# Patient Record
Sex: Male | Born: 1995 | Hispanic: Yes | Marital: Single | Smoking: Never smoker
Health system: Southern US, Community
[De-identification: ages and names within clinical notes are randomized; demographics above are authoritative.]

## PROBLEM LIST (undated history)

## (undated) DIAGNOSIS — K4090 Unilateral inguinal hernia, without obstruction or gangrene, not specified as recurrent: Secondary | ICD-10-CM

## (undated) HISTORY — DX: Unilateral inguinal hernia, without obstruction or gangrene, not specified as recurrent: K40.90

---
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
## 2015-04-13 ENCOUNTER — Emergency Department: Payer: PRIVATE HEALTH INSURANCE

## 2015-04-13 ENCOUNTER — Emergency Department
Admission: EM | Admit: 2015-04-13 | Discharge: 2015-04-13 | Disposition: A | Discharge status: Home / Self Care | Payer: PRIVATE HEALTH INSURANCE | Attending: Emergency Medicine | Admitting: Emergency Medicine

## 2015-04-13 DIAGNOSIS — K4090 Unilateral inguinal hernia, without obstruction or gangrene, not specified as recurrent: Secondary | ICD-10-CM | POA: Diagnosis not present

## 2015-04-13 DIAGNOSIS — N50811 Right testicular pain: Secondary | ICD-10-CM

## 2015-04-13 DIAGNOSIS — N508 Other specified disorders of male genital organs: Secondary | ICD-10-CM | POA: Diagnosis present

## 2015-04-13 LAB — URINALYSIS COMPLETE WITH MICROSCOPIC (ARMC ONLY)
BILIRUBIN URINE: NEGATIVE
Bacteria, UA: NONE SEEN
GLUCOSE, UA: NEGATIVE mg/dL
Ketones, ur: NEGATIVE mg/dL
Leukocytes, UA: NEGATIVE
Nitrite: NEGATIVE
PH: 6 (ref 5.0–8.0)
Protein, ur: NEGATIVE mg/dL
Specific Gravity, Urine: 1.031 — ABNORMAL HIGH (ref 1.005–1.030)

## 2015-04-13 MED ORDER — KETOROLAC TROMETHAMINE 10 MG PO TABS
10.0000 mg | ORAL_TABLET | Freq: Once | ORAL | Status: AC
  Administered 2015-04-13: 10 mg via ORAL
  Filled 2015-04-13: qty 1

## 2015-04-13 MED ORDER — KETOROLAC TROMETHAMINE 10 MG PO TABS: 10.0000 mg | ORAL_TABLET | Freq: Three times a day (TID) | ORAL | Status: DC | PRN

## 2015-04-13 NOTE — ED Notes (Signed)
Patient states right testicular pain "for a while." Tonight feels like something is just not right. Denies difficulty urinating.

## 2015-04-13 NOTE — ED Provider Notes (Signed)
[facility] Emergency Department Provider Note  ____________________________________________  Time seen: 5:10 AM  I have reviewed the triage vital signs and the nursing notes.   HISTORY  Chief Complaint Testicle Pain     HPI Dale Archer is a 19 y.o. male presents with right inguinal pain/right testicular pain times "for a while". Patient states that his previous employment he had to do strenuous lifting persistently and was concerned that he may have "hernia". Patient denies any fever no nausea no vomiting. Current pain score 4 out of 10     Past medical history None There are no active problems to display for this patient.   Past surgical history None No current outpatient prescriptions on file.  Allergies No known drug allergies No family history on file.  Social History Social History  Substance Use Topics  . Smoking status: None  . Smokeless tobacco: None  . Alcohol Use: No    Review of Systems  Constitutional: Negative for fever. Eyes: Negative for visual changes. ENT: Negative for sore throat. Cardiovascular: Negative for chest pain. Respiratory: Negative for shortness of breath. Gastrointestinal: Negative for abdominal pain, vomiting and diarrhea. Genitourinary: Positive right testicular pain Musculoskeletal: Negative for back pain. Skin: Negative for rash. Neurological: Negative for headaches, focal weakness or numbness.   10-point ROS otherwise negative.  ____________________________________________   PHYSICAL EXAM:  VITAL SIGNS: ED Triage Vitals  Enc Vitals Group     BP 04/13/15 0400 167/92 mmHg     Pulse Rate 04/13/15 0400 75     Resp 04/13/15 0500 18     Temp 04/13/15 0400 97.8 F (36.6 C)     Temp Source 04/13/15 0400 Oral     SpO2 04/13/15 0400 100 %     Weight 04/13/15 0400 170 lb (77.111 kg)     Height 04/13/15 0400 5\' 4"  (1.626 m)     Head Cir --      Peak Flow --      Pain Score 04/13/15 0356  5     Pain Loc --      Pain Edu? --      Excl. in GC? --      Constitutional: Alert and oriented. Well appearing and in no distress. Eyes: Conjunctivae are normal. PERRL. Normal extraocular movements. ENT   Head: Normocephalic and atraumatic.   Nose: No congestion/rhinnorhea.   Mouth/Throat: Mucous membranes are moist.   Neck: No stridor. Cardiovascular: Normal rate, regular rhythm. Normal and symmetric distal pulses are present in all extremities. No murmurs, rubs, or gallops. Respiratory: Normal respiratory effort without tachypnea nor retractions. Breath sounds are clear and equal bilaterally. No wheezes/rales/rhonchi. Gastrointestinal: Soft and nontender. No distention. There is no CVA tenderness. Genitourinary: Right Inguinal hernia appreciated on exam Musculoskeletal: Nontender with normal range of motion in all extremities. No joint effusions.  No lower extremity tenderness nor edema. Neurologic:  Normal speech and language. No gross focal neurologic deficits are appreciated. Speech is normal.  Skin:  Skin is warm, dry and intact. No rash noted. Psychiatric: Mood and affect are normal. Speech and behavior are normal. Patient exhibits appropriate insight and judgment.  ____________________________________________    LABS (pertinent positives/negatives)  Labs Reviewed  URINALYSIS COMPLETEWITH MICROSCOPIC ([facility] ONLY)    Radiology:     US Scrotum (Final result) Result time: 04/13/15 05:01:21   Final result by Rad Results In Interface (04/13/15 05:01:21)   Narrative:   CLINICAL DATA: Right testicular pain for 3 months.  EXAM: SCROTAL ULTRASOUND  DOPPLER ULTRASOUND OF THE TESTICLES  TECHNIQUE: Complete ultrasound examination of the testicles, epididymis, and other scrotal structures was performed. Color and spectral Doppler ultrasound were also utilized to evaluate blood flow to the testicles.  COMPARISON: None.  FINDINGS: Right  testicle  Measurements: 3.9 x 2.4 x 2.8 cm. No mass or microlithiasis visualized.  Left testicle  Measurements: 3.8 x 2.4 x 2.8 cm. No mass or microlithiasis visualized.  Right epididymis: Normal in size and appearance.  Left epididymis: Small cyst or spermatocele. Maximal measurement is about 5.5 mm.  Hydrocele: None visualized.  Varicocele: None visualized.  Pulsed Doppler interrogation of both testes demonstrates normal low resistance arterial and venous waveforms bilaterally.  Normal homogeneous flow is demonstrated throughout both testes and epididymides on color flow Doppler imaging.  IMPRESSION: Normal ultrasound appearance of the testicles bilaterally. No evidence of mass or torsion. Small cyst or spermatocele in the left epididymis.   Electronically Signed By: Burman Nieves M.D. On: 04/13/2015 05:01     INITIAL IMPRESSION / ASSESSMENT AND PLAN / ED COURSE  Pertinent labs & imaging results that were available during my care of the patient were reviewed by me and considered in my medical decision making (see chart for details).   History of physical exam concerning for possible right inguinal hernia such patient will be referred to Dr. Excell Seltzer for inguinal hernia evaluation and possible repair. ____________________________________________   FINAL CLINICAL IMPRESSION(S) / ED DIAGNOSES  Final diagnoses:  Right inguinal hernia      Darci Current, MD 04/13/15 [phone]

## 2015-04-13 NOTE — Discharge Instructions (Signed)
Hernia inguinal - Adultos  °(Inguinal Hernia, Adult) ° Los músculos mantienen todos los órganos del cuerpo en el lugar correcto. Pero si se produce un punto débil entre los músculos, algunos pueden protruir. Eso se llama hernia. Cuando esto sucede en la parte inferior del vientre (abdomen), se trata de una hernia inguinal. (Toma su nombre de una parte del cuerpo que en esta región se llamada canal inguinal). Un punto débil en la pared de los músculos deja que un poco de grasa o parte del intestino delgado salgan hacia afuera. Una hernia inguinal puede desarrollarse a cualquier edad. Los hombres la sufren con más frecuencia que las mujeres.  °CAUSAS  °En los adultos, la hernia inguinal desarrolla con el tiempo.  °· Las causas pueden ser: °¨ Un esfuerzo súbito de los músculos de la parte inferior del abdomen. °¨ Levantar objetos pesados. °¨ Dificultad para mover el intestino. La dificultad para mover el intestino (constipación) puede llevar a una hernia. °¨ Tos constante. La causa puede ser el tabaquismo o una enfermedad pulmonar. °¨ Tener sobrepeso. °¨ El embarazo. °¨ Tener un empleo que requiera permanecer largos períodos de pie o levantar objetos pesados. °¨ Haber sufrido de una hernia inguinal anteriormente. °En algunos casos puede convertirse en una situación de emergencia. Cuando esto ocurre, se llama hernia inguinal estrangulada. Se produce cuando una parte del intestino delgado se desliza a través del punto débil y no puede volver al abdomen. El flujo de sangre puede interrumpirse. Si esto ocurre, una parte del intestino puede morir. Esta situación requiere una cirugía de urgencia.  °SÍNTOMAS  °Generalmente una hernia inguinal pequeña no tiene síntomas. Se diagnostica cuando un profesional de la salud hace un examen físico. Las hernias más grandes generalmente presentan síntomas.  °· En los adultos, los síntomas incluyen: °¨ Un bulto en la ingle. Es fácil de detectar cuando la persona está de pie. Puede  desaparecer al estar acostado. °¨ Los hombres pueden tener un bulto en el escroto. °¨ Dolor o ardor en la ingle. Esto ocurre especialmente al levantar objetos, realizar un esfuerzo o toser. °¨ Dolor sordo o sensación de presión en la ingle. °· Los signos de una hernia estrangulada pueden ser: °¨ Una protuberancia en la ingle que duele mucho y está sensible al tacto. °¨ Un bulto que se vuelve de color rojo o púrpura. °¨ Fiebre, náuseas y vómitos. °¨ Imposibilidad de evacuar el intestino o de eliminar gases. °DIAGNÓSTICO  °Para diagnosticar una hernia inguinal, el profesional le hará un examen físico.  °· Incluirá preguntas acerca de los síntomas que haya notado. °· El médico palpará el área de la ingle y le pedirá que tosa. Si palpa una hernia inguinal, el médico podrá tratar de deslizarla de nuevo hacia adentro el abdomen. °· Por lo general no se necesitan otros estudios. °TRATAMIENTO  °Los tratamientos pueden variar. Dependerán del tamaño de la hernia. Las opciones incluyen:  °· Observación cuidadosa. Esto a menudo se sugiere si la hernia es pequeña y usted no ha tenido síntomas. °¨ No se realizará ningún procedimiento médico excepto que aparezcan síntomas. °¨ Tendrá que prestar atención a los síntomas. Si tiene síntomas, comuníquese con su médico de inmediato. °· Cirugía. Se realiza si la hernia es grande o si tiene síntomas. °¨ Cirugía abierta. Por lo general, este es un procedimiento ambulatorio (no tendrá que pasar la noche en el hospital). Se realiza un corte (incisión) a través de la piel de la ingle. La hernia se vuelve a colocar en el interior del abdomen.   Luego se repara la zona débil en los músculos con una herniorrafia o hernioplastia. Herniorrafía: en este tipo de cirugía, se suturan juntos los músculos débiles. Hernioplastía: se coloca un parche o malla para cerrar el área débil en la pared abdominal. °¨ Laparoscopia. En este procedimiento, el cirujano hace incisiones pequeñas. Se coloca en el abdomen  un tubo delgado con una pequeña cámara de video (llamado laparoscopio). El cirujano repara la hernia con una malla, observando en una cámara de vídeo y utilizando dos instrumentos largos. °INSTRUCCIONES PARA EL CUIDADO EN EL HOGAR  °· Después de la cirugía de reparación de una hernia inguinal: °¨ Necesitará tomar un analgésico para el dolor recetado por su médico. Siga cuidadosamente todas las indicaciones. °¨ Tendrá que cuidar la herida de la incisión. °¨ Deberá restringir algunas actividades por un tiempo. Incluirá no levantar objetos pesados   durante varias semanas. Tampoco podrá hacer nada demasiado activo durante algunas semanas. La vuelta al trabajo dependerá del tipo de trabajo que tenga. °· Durante períodos de "espera vigilante", usted debe: °¨ Mantenga un peso saludable. °¨ Consumir una dieta rica en fibra (frutas, verduras y granos enteros). °¨ Beba gran cantidad de líquidos para evitar la constipación. Esto significa beber suficiente agua y otros líquidos para mantener la orina clara o de color amarillo pálido. °¨ No levante objetos pesados. °¨ No permanezca de pie durante largos períodos. °¨ Deje de fumar. Evite toser con frecuencia. °SOLICITE ATENCIÓN MÉDICA SI:  °· Aparece una protuberancia en el área de la ingle. °· Siente dolor, tiene sensación de quemazón o de presión en la ingle. Esto podría empeorar si levanta pesos o hace esfuerzos. °· Tiene fiebre de más de 100.5° F (38.1° C). °SOLICITE ATENCIÓN MÉDICA DE INMEDIATO SI:  °· El dolor en la ingle aumenta repentinamente. °· Una protuberancia en la ingle se hace más grande y no baja. °· En los hombres, un dolor repentino en el escroto. O el escroto aumenta de tamaño. °· Un bulto en el área de la ingle se vuelve de color rojo o púrpura y es dolorosa al tacto. °· Tiene náuseas o vómitos que no desaparecen. °· Siente que su corazón late mucho más rápido de lo normal. °· No puede mover el intestino o eliminar gases. °· Tiene fiebre de más de 102.0° F  (38.9° C). °Document Released: 11/18/2012 °ExitCare® Patient Information ©2015 [employer]. This information is not intended to replace advice given to you by your health care provider. Make sure you discuss any questions you have with your health care provider. ° °

## 2015-04-13 NOTE — ED Notes (Signed)
Patient reports of right intermittent testicle pain x 2-3 months, reports could not sleep yesterday and pain has increased. 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 Patient alert and oriented x 4, respirations even and unlabored.

## 2016-02-03 IMAGING — US US ART/VEN ABD/PELV/SCROTUM DOPPLER LTD
1 series · 14 of 25 positions shown · non-contrast
Comparison: None.

CLINICAL DATA: Right testicular pain for 3 months.

EXAM:
SCROTAL ULTRASOUND
DOPPLER ULTRASOUND OF THE TESTICLES
TECHNIQUE: Complete ultrasound examination of the testicles, epididymis, and
other scrotal structures was performed. Color and spectral Doppler
ultrasound were also utilized to evaluate blood flow to the
testicles.

[Series 1: us art/ven abd/pelv/scrotum doppler ltd · 0.05mm/px · 14 of 51 slices shown]
[im 1/51]
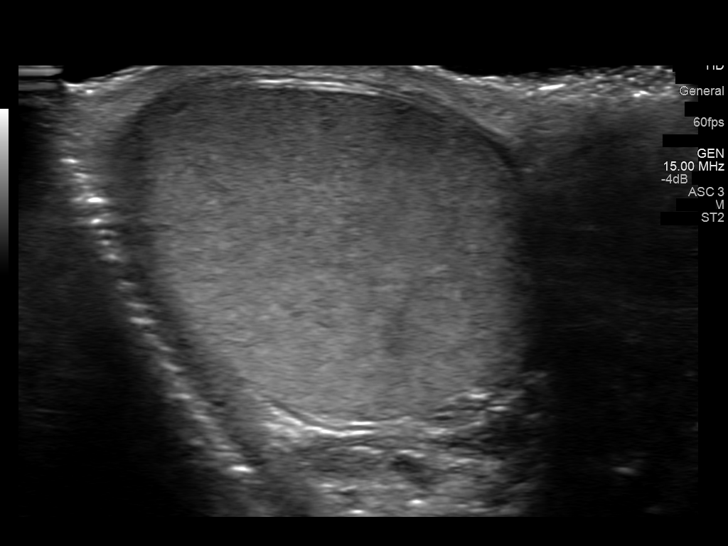
[im 5/51]
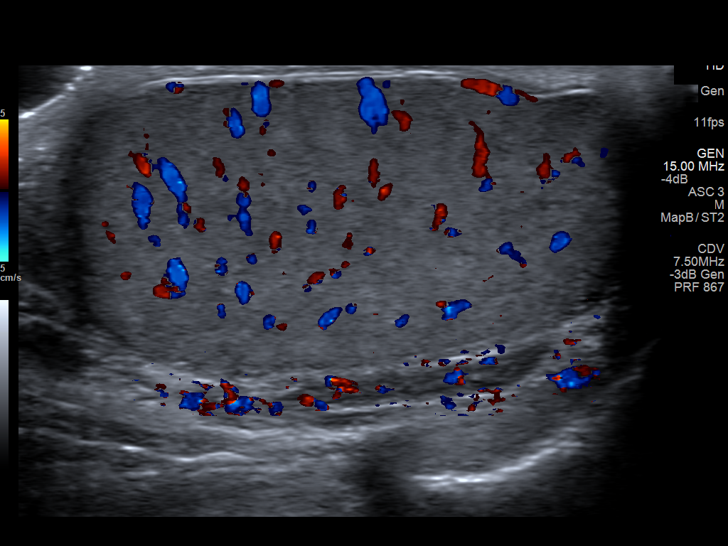
[im 9/51]
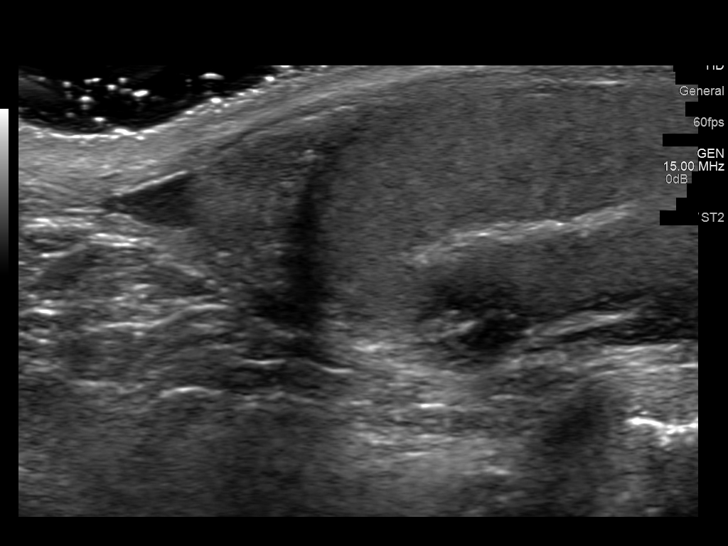
[im 13/51]
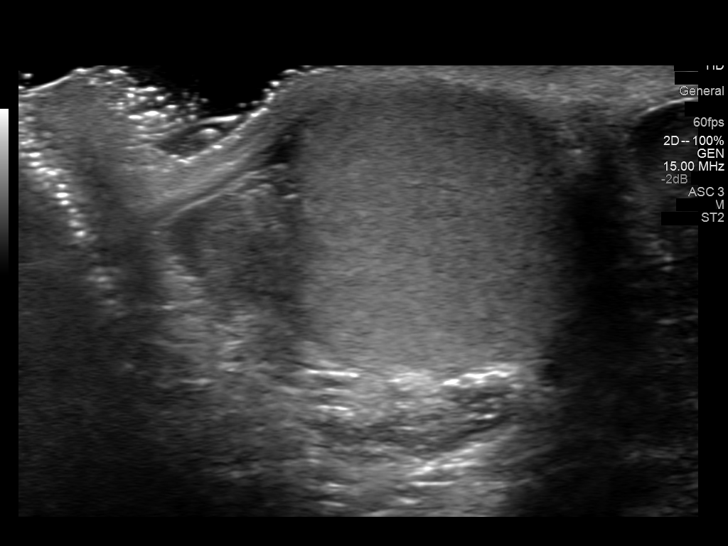
[im 17/51]
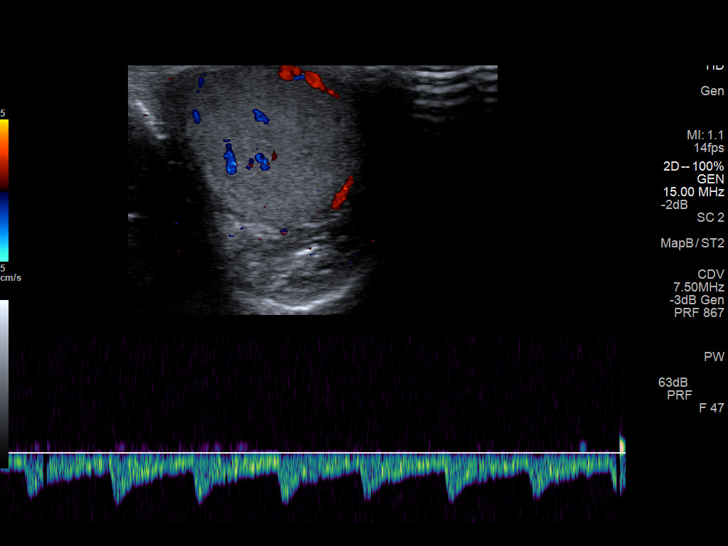
[im 19/51]
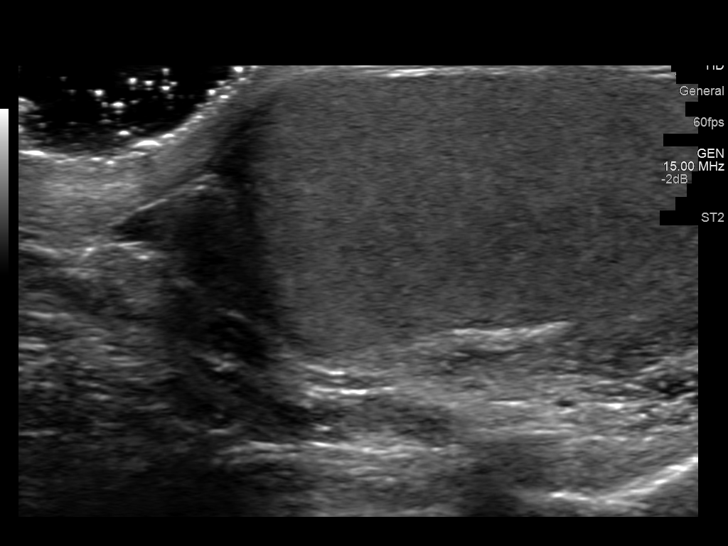
[im 23/51]
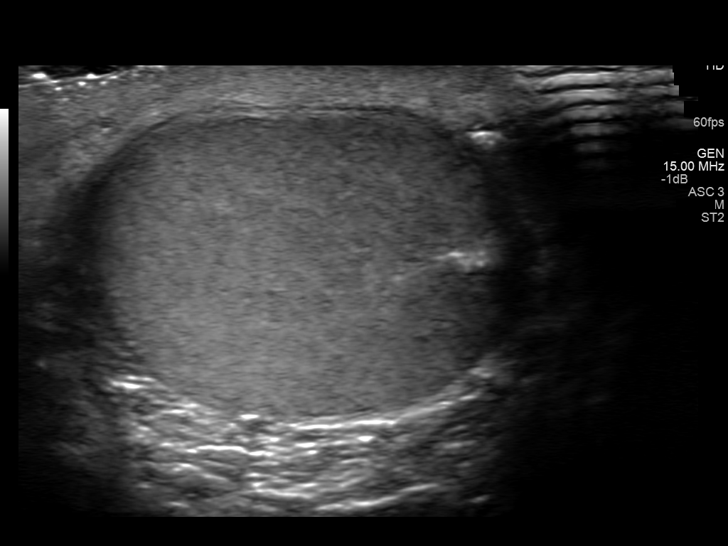
[im 28/51]
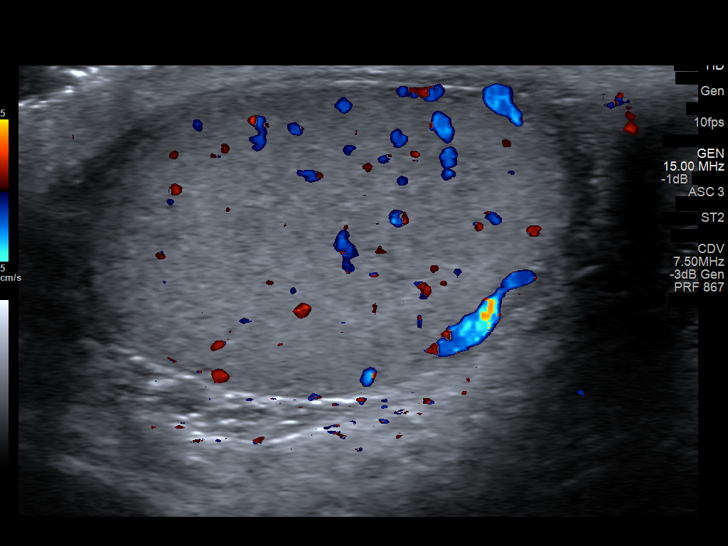
[im 32/51]
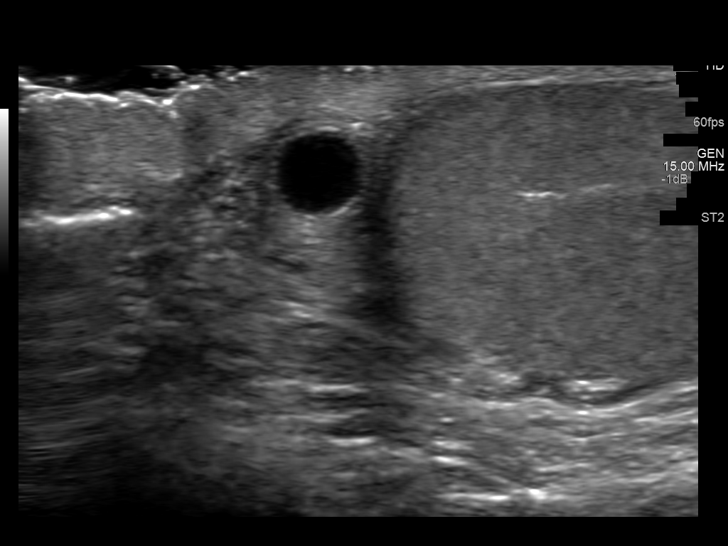
[im 34/51]
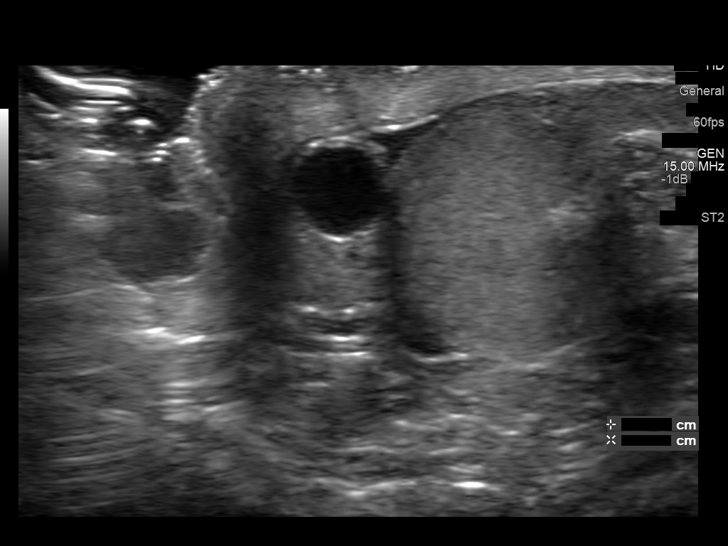
[im 38/51]
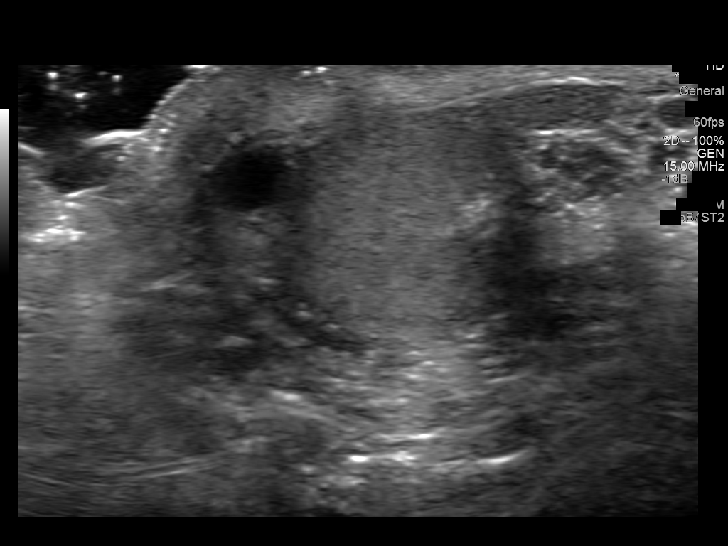
[im 42/51]
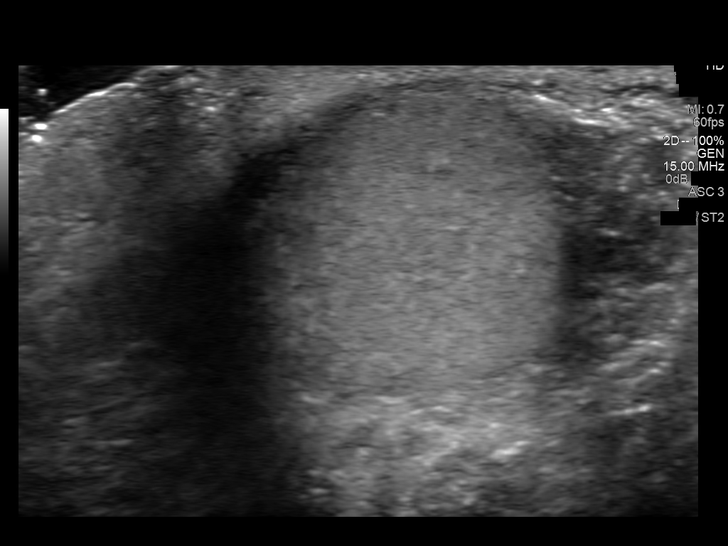
[im 46/51]
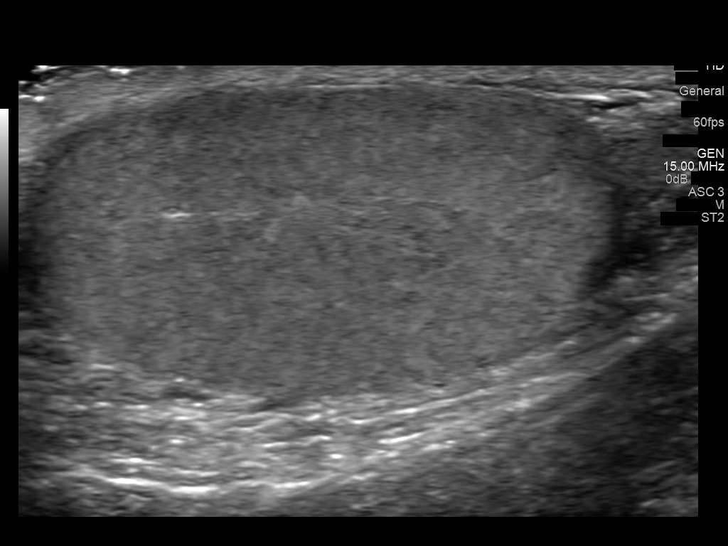
[im 51/51]
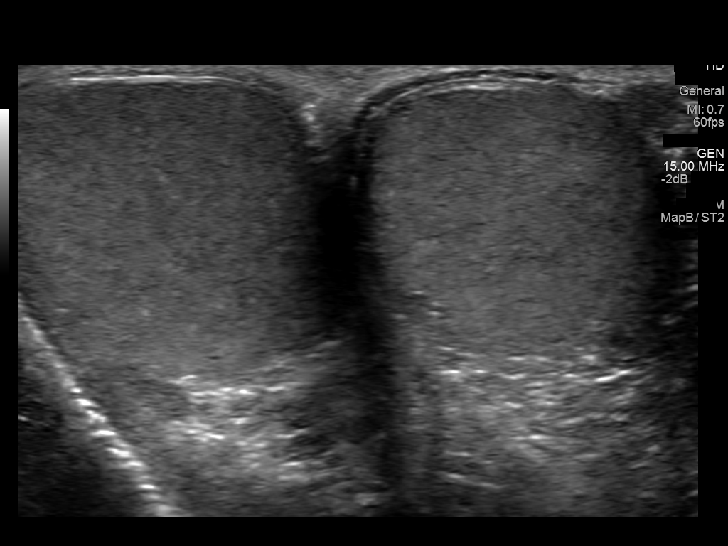

[14 of 25 positions shown; findings below may reference images not displayed]

FINDINGS: Right testicle

Measurements: 3.9 x 2.4 x 2.8 cm. No mass or microlithiasis
visualized.

Left testicle

Measurements: 3.8 x 2.4 x 2.8 cm. No mass or microlithiasis
visualized.

Right epididymis:  Normal in size and appearance.

Left epididymis: Small cyst or spermatocele. Maximal measurement is
about 5.5 mm.

Hydrocele:  None visualized.

Varicocele:  None visualized.

Pulsed Doppler interrogation of both testes demonstrates normal low
resistance arterial and venous waveforms bilaterally.

Normal homogeneous flow is demonstrated throughout both testes and
epididymides on color flow Doppler imaging.
IMPRESSION: Normal ultrasound appearance of the testicles bilaterally. No
evidence of mass or torsion. Small cyst or spermatocele in the left
epididymis.

## 2017-11-22 ENCOUNTER — Encounter: Payer: Self-pay | Admitting: Emergency Medicine

## 2017-11-22 ENCOUNTER — Emergency Department: Payer: PRIVATE HEALTH INSURANCE

## 2017-11-22 ENCOUNTER — Other Ambulatory Visit: Payer: Self-pay

## 2017-11-22 DIAGNOSIS — F1990 Other psychoactive substance use, unspecified, uncomplicated: Secondary | ICD-10-CM | POA: Insufficient documentation

## 2017-11-22 DIAGNOSIS — F419 Anxiety disorder, unspecified: Secondary | ICD-10-CM | POA: Insufficient documentation

## 2017-11-22 NOTE — ED Triage Notes (Addendum)
Patient ambulatory to triage with steady gait, without difficulty; pt anxious, wearing sunglasses; pt reports this evening having sensation of chest pounding after [facility]; denies hx of same and denies symptoms at present; st "I feel like I need to be honest with you, I recently began a job and feel overwhelmed and feel like my sleep schedule has not adjusted, not sleeping well; went some where and got something; a piece of paper with a picachu on it and took it"; pt cont to be very anxious; pt refusing blood work and urine specimen; explained importance of performing such but pt cont to refuse and leaves triage room to go back to lobby; 1st nurse R David RN notified of pt's behav and refusal for protocols

## 2017-11-23 ENCOUNTER — Emergency Department
Admission: EM | Admit: 2017-11-23 | Discharge: 2017-11-23 | Disposition: A | Discharge status: Home / Self Care | Payer: PRIVATE HEALTH INSURANCE | Attending: Emergency Medicine | Admitting: Emergency Medicine

## 2017-11-23 DIAGNOSIS — F419 Anxiety disorder, unspecified: Secondary | ICD-10-CM

## 2017-11-23 DIAGNOSIS — F1990 Other psychoactive substance use, unspecified, uncomplicated: Secondary | ICD-10-CM

## 2017-11-23 NOTE — ED Provider Notes (Signed)
[facility] Emergency Department Provider Note  ____________________________________________   First MD Initiated Contact with Patient 11/23/17 [phone]     (approximate)  I have reviewed the triage vital signs and the nursing notes.   HISTORY  Chief Complaint Palpitations and Anxiety  Level 5 caveat:  history/ROS may be limited by acute intoxication/drug use  HPI Dale Archer is a 22 y.o. male reports no chronic medical history and presents by private vehicle for evaluation of anxiety and chest discomfort.  He reports that he has been under stress at work and has not been sleeping recently.  He smokes tobacco and occasional marijuana but he also uses acid.  He dropped acid earlier today and then was working out and felt his heart pounding and felt very strange.  He thinks he "freaked out a little bit" and he and his girlfriend decided he should get checked out.  He refused lab work upfront because he states that he is scared of needles.  He did consent to a chest x-ray and EKG.  At this point, by the time I saw him hours into his emergency department stay due to waiting for an exam room, he says he feels completely normal although he admits that he is still under the influence.  He denies suicidal ideation and homicidal ideation.  He states that he thinks he just freaked out a bit but feels normal now.  History reviewed. No pertinent past medical history.  There are no active problems to display for this patient.   History reviewed. No pertinent surgical history.  Prior to Admission medications   Medication Sig Start Date End Date Taking? Authorizing Provider  ketorolac (TORADOL) 10 MG tablet Take 1 tablet (10 mg total) by mouth every 8 (eight) hours as needed. 04/13/15   Darci Current, MD    Allergies Patient has no known allergies.  No family history on file.  Social History Social History   Tobacco Use  . Smoking status: Never Smoker  .  Smokeless tobacco: Never Used  Substance Use Topics  . Alcohol use: No  . Drug use: Yes    Types: Marijuana    Review of Systems Level 5 caveat:  history/ROS may be limited by acute intoxication/drug use  Constitutional: No fever/chills Eyes: No visual changes. ENT: No sore throat. Cardiovascular: chest pain earlier, now resolved Respiratory: shortness of breath earlier, now resolved Gastrointestinal: No abdominal pain.  No nausea, no vomiting.  No diarrhea.  No constipation. Genitourinary: Negative for dysuria. Musculoskeletal: Negative for neck pain.  Negative for back pain. Integumentary: Negative for rash. Neurological: Negative for headaches, focal weakness or numbness.   ____________________________________________   PHYSICAL EXAM:  VITAL SIGNS: ED Triage Vitals  Enc Vitals Group     BP 11/22/17 2308 (!) 172/67     Pulse Rate 11/22/17 2308 98     Resp 11/22/17 2308 18     Temp --      Temp Source 11/22/17 2308 Oral     SpO2 11/22/17 2308 100 %     Weight 11/22/17 2308 83.9 kg (185 lb)     Height 11/22/17 2308 1.651 m (5\' 5" )     Head Circumference --      Peak Flow --      Pain Score 11/22/17 2307 0     Pain Loc --      Pain Edu? --      Excl. in GC? --     Constitutional: Alert and  oriented. Well appearing and in no acute distress. Eyes: Conjunctivae are normal.  Head: Atraumatic. Nose: No congestion/rhinnorhea. Mouth/Throat: Mucous membranes are moist. Neck: No stridor.  No meningeal signs.   Cardiovascular: Normal rate, regular rhythm. Good peripheral circulation. Grossly normal heart sounds. Respiratory: Normal respiratory effort.  No retractions. Lungs CTAB. Gastrointestinal: Soft and nontender. No distention.  Musculoskeletal: No lower extremity tenderness nor edema. No gross deformities of extremities. Neurologic:  Normal speech and language. No gross focal neurologic deficits are appreciated.  Skin:  Skin is warm, dry and intact. No rash  noted. Psychiatric: Mood and affect are odd, likely the result of some drug use, but he is not inappropriate, denies suicidality and homicidal ideation, and under the circumstances is acting relatively normal by the time that I saw him.  He shows good insight into the fact that his drug use is likely affecting the way he feels.  ____________________________________________   LABS (all labs ordered are listed, but only abnormal results are displayed)  Labs Reviewed - No data to display ____________________________________________  EKG  ED ECG REPORT I, Loleta Roseory Priyana Mccarey, the attending physician, personally viewed and interpreted this ECG.  Date: 11/22/2017 EKG Time: 23: 11 Rate: 101 Rhythm: Borderline sinus tachycardia QRS Axis: normal Intervals: normal ST/T Wave abnormalities: normal Narrative Interpretation: no evidence of acute ischemia  ____________________________________________  RADIOLOGY I, Loleta Roseory Alizea Pell, personally viewed and evaluated these images (plain radiographs) as part of my medical decision making, as well as reviewing the written report by the radiologist.  ED MD interpretation: No evidence of acute intrathoracic process  Official radiology report(s): Dg Chest 2 View  Result Date: 11/23/2017 CLINICAL DATA:  Acute onset of shortness of breath. Chest pounding. EXAM: CHEST - 2 VIEW COMPARISON:  None. FINDINGS: The lungs are well-aerated and clear. There is no evidence of focal opacification, pleural effusion or pneumothorax. The heart is normal in size; the mediastinal contour is within normal limits. No acute osseous abnormalities are seen. IMPRESSION: No acute cardiopulmonary process seen. Electronically Signed   By: Roanna RaiderJeffery  Chang M.D.   On: 11/23/2017 00:01    ____________________________________________   PROCEDURES  Critical Care performed: No   Procedure(s) performed:   Procedures   ____________________________________________   INITIAL IMPRESSION  / ASSESSMENT AND PLAN / ED COURSE  As part of my medical decision making, I reviewed the following data within the electronic MEDICAL RECORD NUMBER Nursing notes reviewed and incorporated, EKG interpreted  and Radiograph reviewed     Differential diagnosis includes, but is not limited to, drug use and its associated side effects, pneumothorax, ACS, pulmonary embolism.  Far and above my primary diagnosis is anxiety and drug use side effects.  He seems to be almost back to normal although he admits he is still likely under the influence.  He is not acting dangerous or inappropriate and he does not meet any criteria for involuntary commitment.  He did not drive himself here tonight and his girlfriend is going to come pick him up.  I offered to let him stay in the emergency department to speak with her psychiatrist but he declines and again he does not meet IVC requirements nor do I believe he would benefit from inpatient hospitalization or treatment for psychiatric illness.  We discussed following up with [facility] for help with his social and job stressors in addition to the drug use and he agrees this would be a good idea.  He spoke favorably of his girlfriend who he also describes as the mother of  his children and I do not believe he represents a danger to her or any of their kids.  I encouraged him to seek help for his issues but there is no indication for keeping him against as well in the emergency department tonight.  He feels strongly about not obtaining any blood work and I do not think it would be helpful tonight.  I gave my usual and customary return precautions.  He understands and agrees with plan.     ____________________________________________  FINAL CLINICAL IMPRESSION(S) / ED DIAGNOSES  Final diagnoses:  Anxiety  Drug use     MEDICATIONS GIVEN DURING THIS VISIT:  Medications - No data to display   ED Discharge Orders    None       Note:  This document was prepared using Dragon  voice recognition software and may include unintentional dictation errors.    Loleta Rose, MD 11/23/17 [phone]

## 2017-11-23 NOTE — Discharge Instructions (Signed)
Your workup in the Emergency Department today was reassuring.  We think your symptoms are likely the result of the drug use you admitted to us as well as the pressure and stress that you are under at work.  We offered for you to stay and speak with a psychiatrist here in the emergency department but you declined to do so and we think it is appropriate [facility]  Please refer to the contact information included in this paperwork; give them a call and schedule the next available appointment, or refer to the walk-in clinic hours listed below:  Walk-in ASSESSMENT hours, M-W-F, 8:00am - 3:00pm Advanced Acess CRISIS:  M-F, 8:00am - 8:00pm Outpatient Services Office Hours:  M-F, 8:00am - 5:00pm  Return to the Emergency Department if you develop new or worsening symptoms that concern you.

## 2017-11-23 NOTE — ED Notes (Signed)
Attempted to draw patients blood again, patient states "I am scared of needles". Girlfriend attempting to calm patient but patient still not able to sit still and let this RN draw sample.

## 2018-12-03 ENCOUNTER — Emergency Department
Admission: EM | Admit: 2018-12-03 | Discharge: 2018-12-03 | Disposition: A | Discharge status: Home / Self Care | Payer: 59 | Attending: Emergency Medicine | Admitting: Emergency Medicine

## 2018-12-03 ENCOUNTER — Encounter: Payer: Self-pay | Admitting: Emergency Medicine

## 2018-12-03 ENCOUNTER — Emergency Department: Payer: 59

## 2018-12-03 DIAGNOSIS — G510 Bell's palsy: Secondary | ICD-10-CM | POA: Insufficient documentation

## 2018-12-03 DIAGNOSIS — R29810 Facial weakness: Secondary | ICD-10-CM | POA: Diagnosis present

## 2018-12-03 LAB — CBC WITH DIFFERENTIAL/PLATELET
Abs Immature Granulocytes: 0.04 10*3/uL (ref 0.00–0.07)
Basophils Absolute: 0 10*3/uL (ref 0.0–0.1)
Basophils Relative: 0 %
Eosinophils Absolute: 0.1 10*3/uL (ref 0.0–0.5)
Eosinophils Relative: 1 %
HCT: 49.4 % (ref 39.0–52.0)
Hemoglobin: 17.1 g/dL — ABNORMAL HIGH (ref 13.0–17.0)
Immature Granulocytes: 0 %
Lymphocytes Relative: 22 %
Lymphs Abs: 2.2 10*3/uL (ref 0.7–4.0)
MCH: 30.3 pg (ref 26.0–34.0)
MCHC: 34.6 g/dL (ref 30.0–36.0)
MCV: 87.4 fL (ref 80.0–100.0)
Monocytes Absolute: 0.7 10*3/uL (ref 0.1–1.0)
Monocytes Relative: 7 %
Neutro Abs: 6.9 10*3/uL (ref 1.7–7.7)
Neutrophils Relative %: 70 %
Platelets: 313 10*3/uL (ref 150–400)
RBC: 5.65 MIL/uL (ref 4.22–5.81)
RDW: 11.9 % (ref 11.5–15.5)
WBC: 10.1 10*3/uL (ref 4.0–10.5)
nRBC: 0 % (ref 0.0–0.2)

## 2018-12-03 LAB — BASIC METABOLIC PANEL
Anion gap: 9 (ref 5–15)
BUN: 10 mg/dL (ref 6–20)
CO2: 28 mmol/L (ref 22–32)
Calcium: 9.7 mg/dL (ref 8.9–10.3)
Chloride: 103 mmol/L (ref 98–111)
Creatinine, Ser: 0.83 mg/dL (ref 0.61–1.24)
GFR calc Af Amer: >60 mL/min (ref >60)
GFR calc non Af Amer: >60 mL/min (ref >60)
Glucose, Bld: 124 mg/dL — ABNORMAL HIGH (ref 70–99)
Potassium: 3.4 mmol/L — ABNORMAL LOW (ref 3.5–5.1)
Sodium: 140 mmol/L (ref 135–145)

## 2018-12-03 MED ORDER — VALACYCLOVIR HCL 1 G PO TABS: 1000.0000 mg | ORAL_TABLET | Freq: Three times a day (TID) | ORAL | 0 refills | Status: AC

## 2018-12-03 MED ORDER — PREDNISONE 10 MG PO TABS: 10.0000 mg | ORAL_TABLET | Freq: Every day | ORAL | 0 refills | Status: DC

## 2018-12-03 NOTE — ED Provider Notes (Signed)
[facility] Emergency Department Provider Note  ____________________________________________  Time seen: Approximately 6:42 PM  I have reviewed the triage vital signs and the nursing notes.   HISTORY  Chief Complaint Facial Droop and Numbness    HPI Dale Archer is a 23 y.o. male who presents emergency department complaining of right-sided facial droop and numbness.  Patient reports that 2 days ago he developed some posterior right ear pain.  Patient reports they took some Tylenol and the pain went away.  Over the last 24 hours he has noticed increasing facial droop, inability to fully close his eye unless he concentrates on it, numbness to the right side of the face, slight taste changes primarily to the right side of the tongue.  Patient denies any headache, numbness or tingling in any extremity.  No history of hypertension, CVA, heart disease.  Patient reports that he had a common cold approximately 10 days prior.  No other complaints at this time.         History reviewed. No pertinent past medical history.  There are no active problems to display for this patient.   History reviewed. No pertinent surgical history.  Prior to Admission medications   Medication Sig Start Date End Date Taking? Authorizing Provider  predniSONE (DELTASONE) 10 MG tablet Take 1 tablet (10 mg total) by mouth daily. 12/03/18   Cuthriell, Delorise Royals, PA-C  valACYclovir (VALTREX) 1000 MG tablet Take 1 tablet (1,000 mg total) by mouth 3 (three) times daily for 7 days. 12/03/18 12/10/18  Cuthriell, Delorise Royals, PA-C    Allergies Patient has no known allergies.  No family history on file.  Social History Social History   Tobacco Use  . Smoking status: Never Smoker  . Smokeless tobacco: Never Used  Substance Use Topics  . Alcohol use: No  . Drug use: Yes    Types: Marijuana     Review of Systems  Constitutional: No fever/chills Eyes: No visual changes. No  discharge ENT: No upper respiratory complaints. Cardiovascular: no chest pain. Respiratory: no cough. No SOB. Gastrointestinal: No abdominal pain.  No nausea, no vomiting.  Musculoskeletal: Negative for musculoskeletal pain. Skin: Negative for rash, abrasions, lacerations, ecchymosis. Neurological: Positive for right-sided facial droop.  Denies focal weakness or numbness. 10-point ROS otherwise negative.  ____________________________________________   PHYSICAL EXAM:  VITAL SIGNS: ED Triage Vitals  Enc Vitals Group     BP 12/03/18 1629 (!) 174/100     Pulse Rate 12/03/18 1629 (!) 104     Resp 12/03/18 1629 20     Temp 12/03/18 1629 98.6 F (37 C)     Temp Source 12/03/18 1629 Oral     SpO2 12/03/18 1629 99 %     Weight --      Height --      Head Circumference --      Peak Flow --      Pain Score 12/03/18 1631 0     Pain Loc --      Pain Edu? --      Excl. in GC? --      Constitutional: Alert and oriented. Well appearing and in no acute distress. Eyes: Conjunctivae are normal. PERRL. EOMI. Head: Obvious right-sided facial droop.  This does not spare the forehead as patient is unable to move the right eyebrow or right forehead.  Patient does have the ability to close his right eye on command.  Minor loss of the nasolabial fold on the right side.  Sensation  is decreased in facial nerve distribution right side of the face when compared with left. ENT:      Ears:       Nose: No congestion/rhinnorhea.      Mouth/Throat: Mucous membranes are moist.  Neck: No stridor.    Cardiovascular: Normal rate, regular rhythm. Normal S1 and S2.  Good peripheral circulation. Respiratory: Normal respiratory effort without tachypnea or retractions. Lungs CTAB. Good air entry to the bases with no decreased or absent breath sounds. Musculoskeletal: Full range of motion to all extremities. No gross deformities appreciated. Neurologic:  Normal speech and language.  Patient has obvious right-sided  facial droop.  Patient is unable to raise the right forehead or eyebrow.  Obvious right-sided facial paralysis. Skin:  Skin is warm, dry and intact. No rash noted. Psychiatric: Mood and affect are normal. Speech and behavior are normal. Patient exhibits appropriate insight and judgement.   ____________________________________________   LABS (all labs ordered are listed, but only abnormal results are displayed)  Labs Reviewed  CBC WITH DIFFERENTIAL/PLATELET - Abnormal; Notable for the following components:      Result Value   Hemoglobin 17.1 (*)    All other components within normal limits  BASIC METABOLIC PANEL - Abnormal; Notable for the following components:   Potassium 3.4 (*)    Glucose, Bld 124 (*)    All other components within normal limits   ____________________________________________  EKG   ____________________________________________  RADIOLOGY I personally viewed and evaluated these images as part of my medical decision making, as well as reviewing the written report by the radiologist.  Ct Head Wo Contrast  Result Date: 12/03/2018 CLINICAL DATA:  Right-sided facial numbness and droop EXAM: CT HEAD WITHOUT CONTRAST TECHNIQUE: Contiguous axial images were obtained from the base of the skull through the vertex without intravenous contrast. COMPARISON:  March 08, 2007 FINDINGS: Brain: The ventricles are normal in size and configuration. There is no intracranial mass hemorrhage, extra-axial fluid collection, or midline shift. Brain parenchyma appears unremarkable. No evident acute infarct. Vascular: No hyperdense vessel.  No evident vascular calcification. Skull: The bony calvarium appears intact. Sinuses/Orbits: Visualized paranasal sinuses are clear. Visualized orbits appear symmetric bilaterally. Other: Visualized mastoid air cells are clear. IMPRESSION: Study within normal limits. Electronically Signed   By: Bretta Bang III M.D.   On: 12/03/2018 16:56     ____________________________________________    PROCEDURES  Procedure(s) performed:    Procedures    Medications - No data to display   ____________________________________________   INITIAL IMPRESSION / ASSESSMENT AND PLAN / ED COURSE  Pertinent labs & imaging results that were available during my care of the patient were reviewed by me and considered in my medical decision making (see chart for details).  Review of the [place] CSRS was performed in accordance of the [facility] prior to dispensing any controlled drugs.           Patient's diagnosis is consistent with Bell's palsy.  Patient presented to the emergency department with right-sided facial droop.  No other concerning neurological symptoms.  Patient did have a viral illness approximately 10 days ago.  Negative CT scan.  Findings are consistent with Bell's palsy.  No indication of central lesion.  At this time patient will be treated with antiviral and steroid.  Patient is to follow-up primary care as needed..Patient is given ED precautions to return to the ED for any worsening or new symptoms.     ____________________________________________  FINAL CLINICAL IMPRESSION(S) / ED DIAGNOSES  Final  diagnoses:  Bell's palsy      NEW MEDICATIONS STARTED DURING THIS VISIT:  ED Discharge Orders         Ordered    valACYclovir (VALTREX) 1000 MG tablet  3 times daily     12/03/18 1902    predniSONE (DELTASONE) 10 MG tablet  Daily    Note to Pharmacy:  Take 6 pills x 2 days, 5 pills x 2 days, 4 pills x 2 days, 3 pills x 2 days, 2 pills x 2 days, and 1 pill x 2 days   12/03/18 1902              This chart was dictated using voice recognition software/Dragon. Despite best efforts to proofread, errors can occur which can change the meaning. Any change was purely unintentional.    Racheal PatchesCuthriell, Jonathan D, PA-C 12/03/18 2029    Jeanmarie PlantMcShane, James A, MD 12/03/18 2251

## 2018-12-03 NOTE — ED Triage Notes (Addendum)
Patient reports numbness and droop to right side of face, worsening for the last 2 days. Patient denies numbness or weakness anywhere else. No decreased sensation or drift noted in arms or legs. Patient reports he cannot close right eye because muscles feel weak. Patient also reports intermittent headache and neck pain.

## 2018-12-03 NOTE — ED Notes (Signed)
See triage note  Presents with right sided facial drooping  States he noticed some numbness to side of face yesterday   Also is having diff closing right eye  States he is also having some discomfort in right side of neck for several days  States he has been doing projects at home and not sure if he pulled a muscle in neck

## 2019-05-15 ENCOUNTER — Other Ambulatory Visit: Payer: Self-pay

## 2019-05-15 DIAGNOSIS — Z20822 Contact with and (suspected) exposure to covid-19: Secondary | ICD-10-CM

## 2019-05-17 LAB — NOVEL CORONAVIRUS, NAA: SARS-CoV-2, NAA: NOT DETECTED

## 2019-05-29 IMAGING — CR DG CHEST 2V
2 series · 2 of 2 positions shown · non-contrast
Comparison: None.

CLINICAL DATA: Acute onset of shortness of breath. Chest pounding.

EXAM:
CHEST - 2 VIEW

[chest pa]
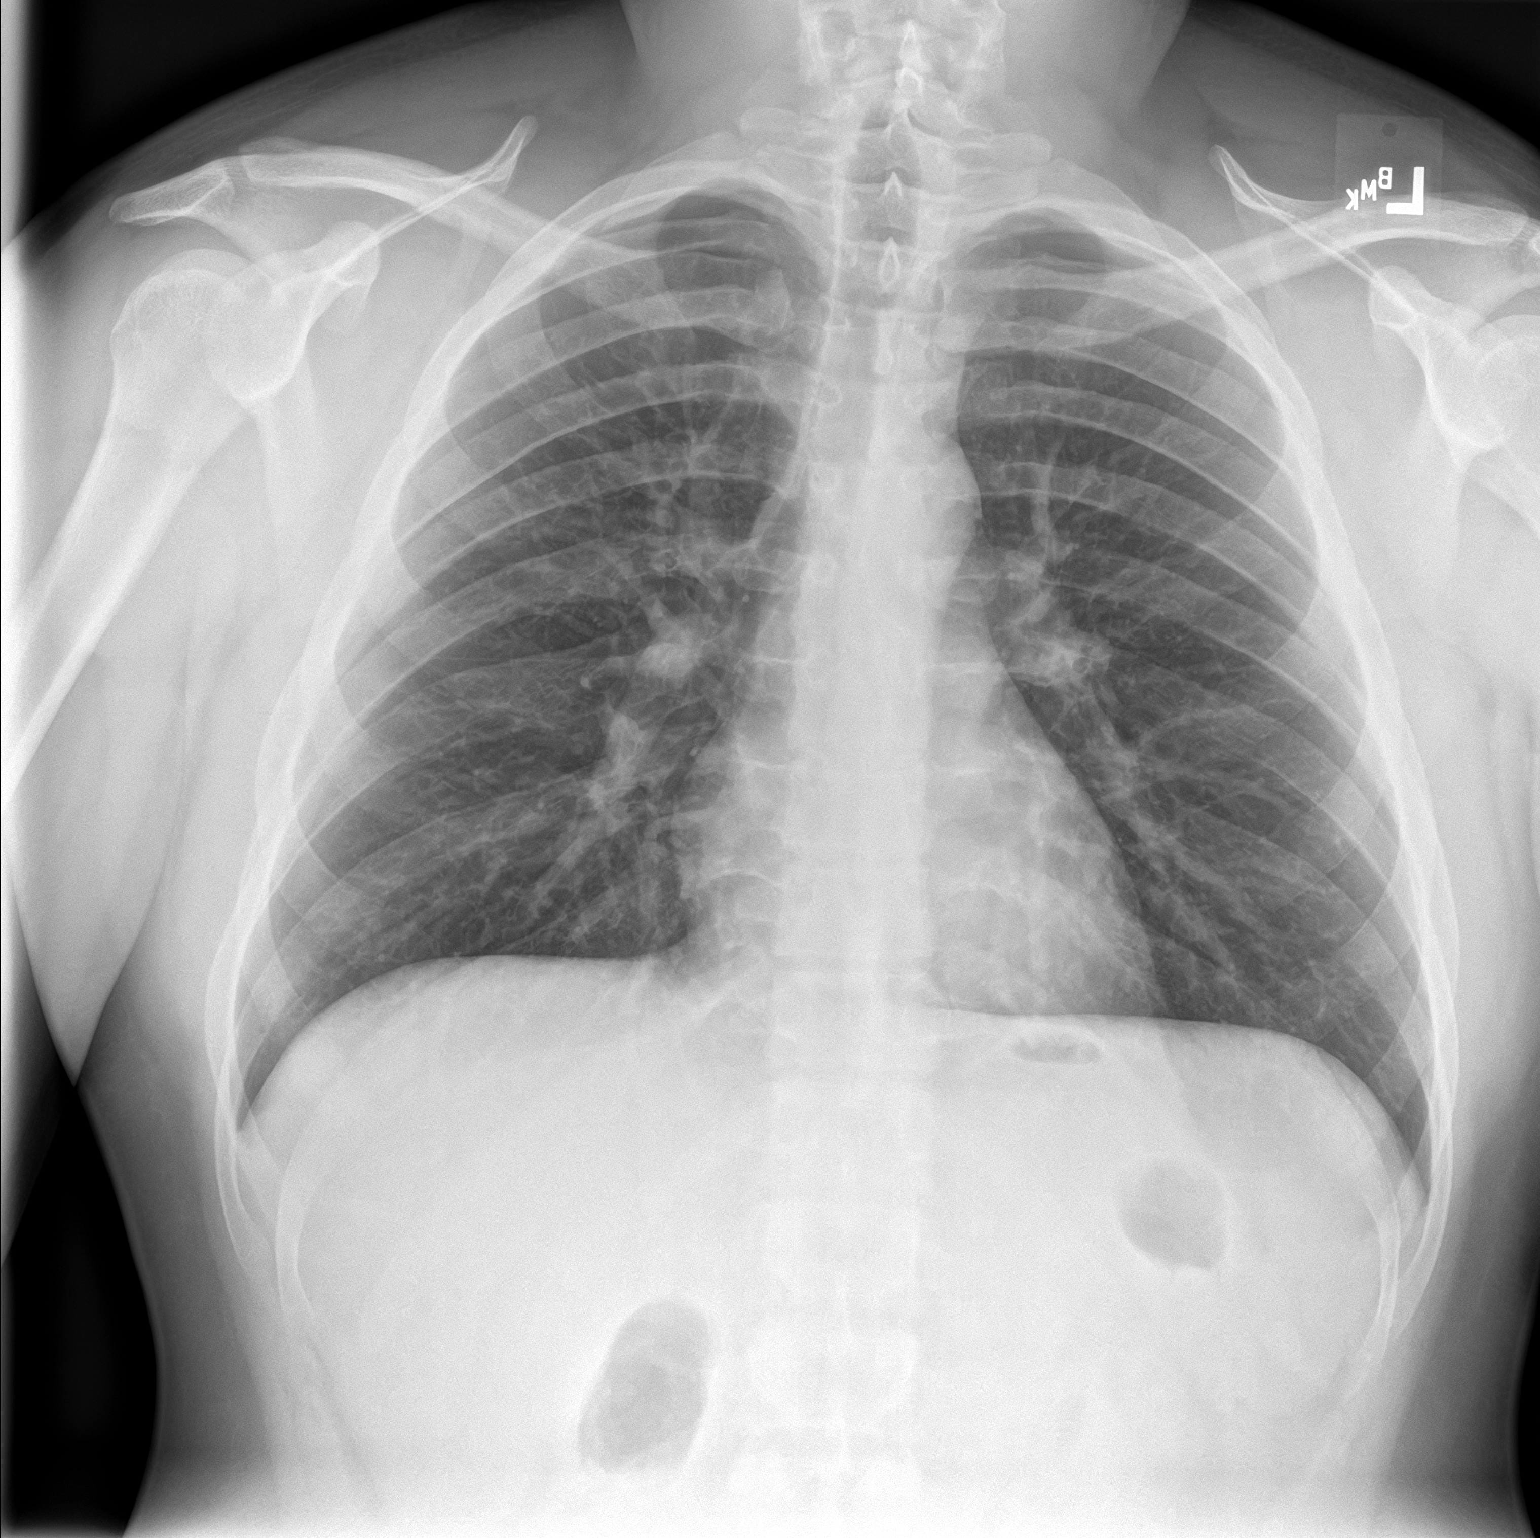

[chest lat]
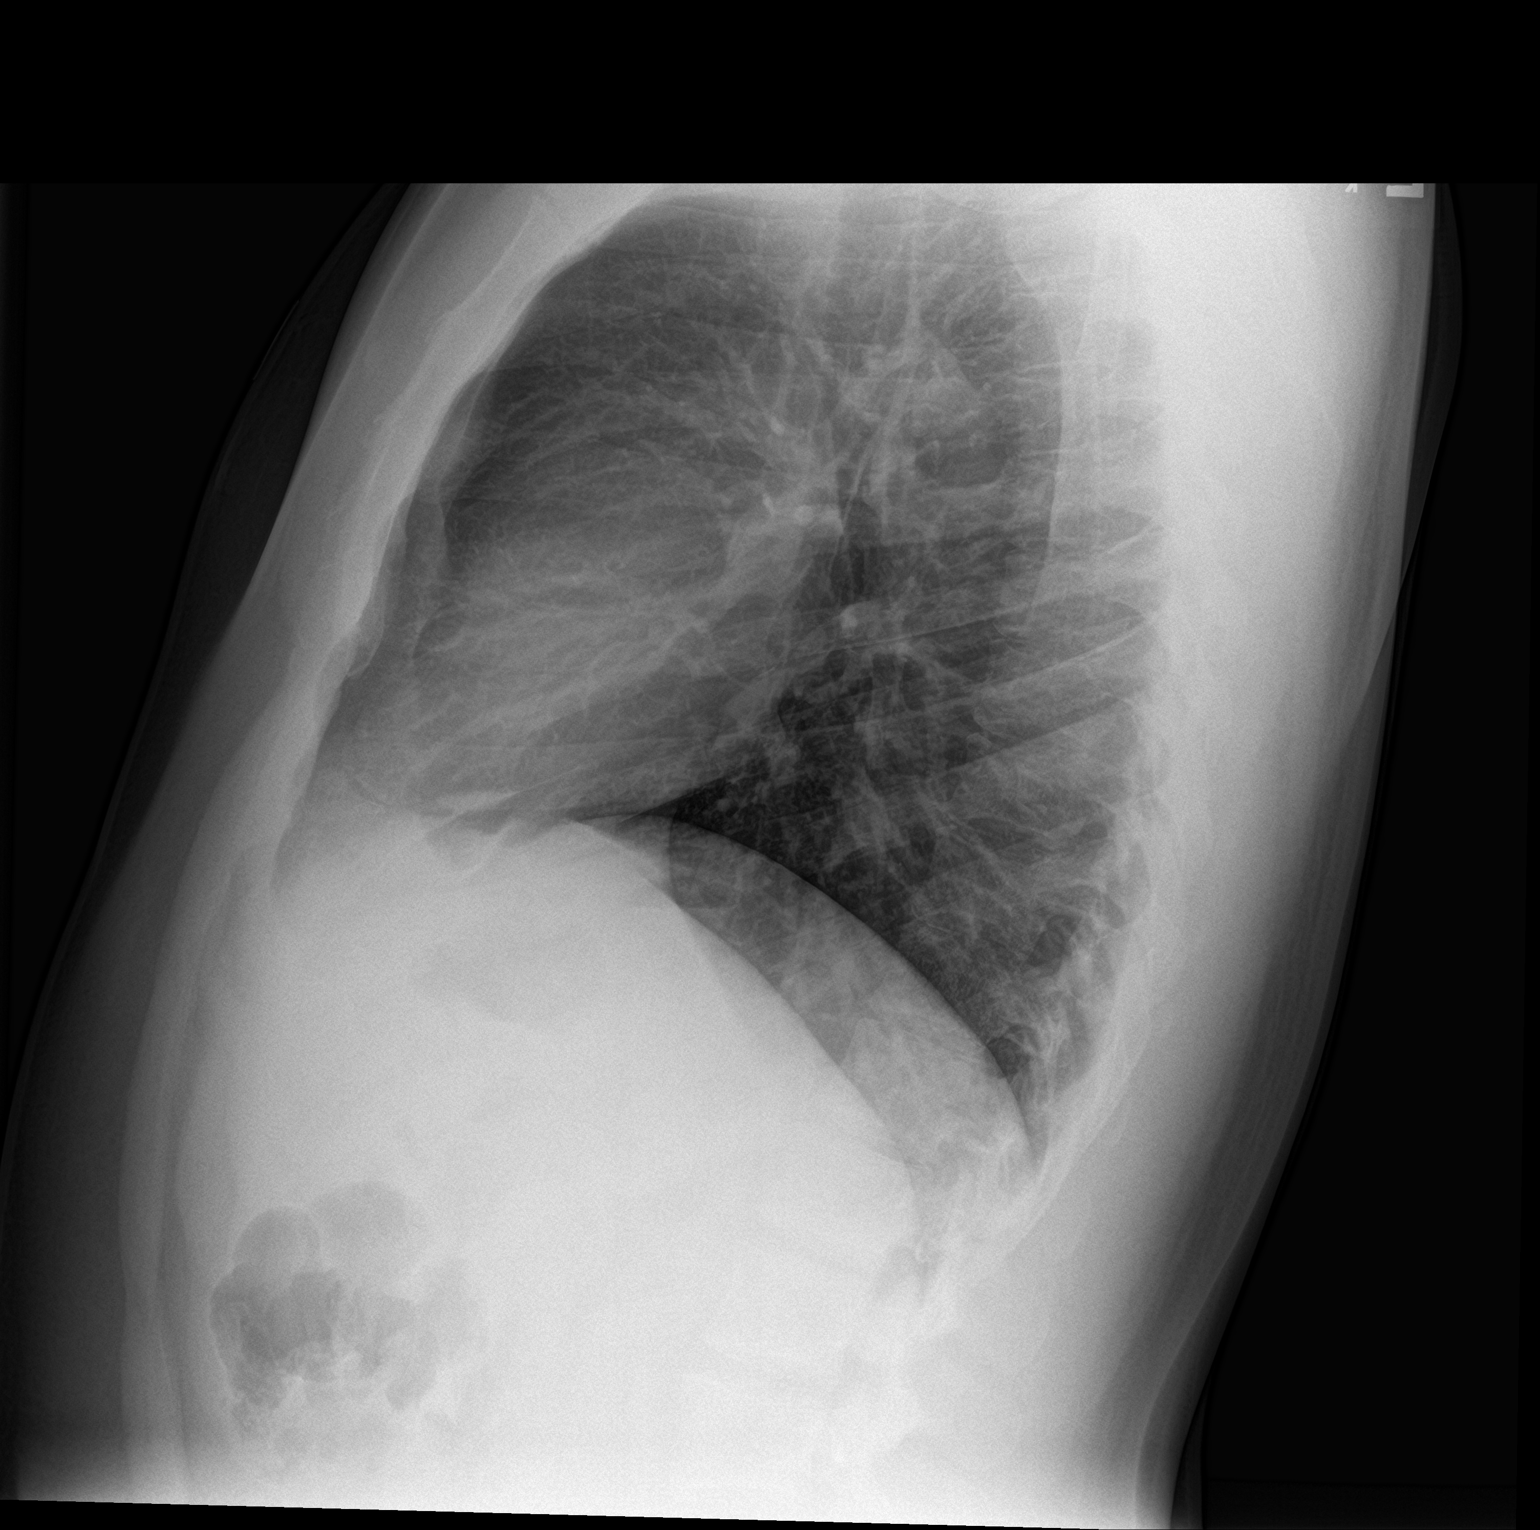

[2 of 2 positions shown; findings below may reference images not displayed]

FINDINGS: The lungs are well-aerated and clear. There is no evidence of focal
opacification, pleural effusion or pneumothorax.

The heart is normal in size; the mediastinal contour is within
normal limits. No acute osseous abnormalities are seen.
IMPRESSION: No acute cardiopulmonary process seen.

## 2019-07-18 ENCOUNTER — Other Ambulatory Visit: Payer: Self-pay

## 2019-07-18 DIAGNOSIS — Z20822 Contact with and (suspected) exposure to covid-19: Secondary | ICD-10-CM

## 2019-07-21 LAB — NOVEL CORONAVIRUS, NAA: SARS-CoV-2, NAA: NOT DETECTED

## 2021-01-25 ENCOUNTER — Other Ambulatory Visit: Payer: Self-pay

## 2021-01-25 NOTE — Progress Notes (Signed)
Pt presents today to complete pre-employment drugs screen.  Resulted: Negative

## 2021-02-11 ENCOUNTER — Other Ambulatory Visit: Payer: Self-pay

## 2021-02-11 ENCOUNTER — Ambulatory Visit: Payer: Self-pay

## 2021-02-11 DIAGNOSIS — Z0110 Encounter for examination of ears and hearing without abnormal findings: Secondary | ICD-10-CM

## 2021-02-11 NOTE — Progress Notes (Signed)
Presents to [facility] for baseline hearing test. 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  Dept is part of [facility] doctor.  AMD

## 2021-03-01 NOTE — Progress Notes (Deleted)
Pt scheduled to complete annual physical 03/08/21.

## 2021-03-08 ENCOUNTER — Encounter: Payer: Medicaid Other | Admitting: Physician Assistant

## 2021-03-09 ENCOUNTER — Ambulatory Visit: Payer: Self-pay

## 2021-03-09 ENCOUNTER — Other Ambulatory Visit: Payer: Self-pay

## 2021-03-09 DIAGNOSIS — Z0000 Encounter for general adult medical examination without abnormal findings: Secondary | ICD-10-CM

## 2021-03-09 LAB — POCT URINALYSIS DIPSTICK
Bilirubin, UA: NEGATIVE
Blood, UA: POSITIVE
Glucose, UA: NEGATIVE
Ketones, UA: NEGATIVE
Leukocytes, UA: NEGATIVE
Nitrite, UA: NEGATIVE
Protein, UA: NEGATIVE
Spec Grav, UA: 1.025 (ref 1.010–1.025)
Urobilinogen, UA: 0.2 E.U./dL
pH, UA: 6 (ref 5.0–8.0)

## 2021-03-09 NOTE — Progress Notes (Signed)
Pt scheduled annual physical 03/15/21  Reviewed CDC recommendations for importance of HIV/ Hep C screening once in lifetime. Patient has declined HIV / Hep C screenings today and will let us know if they should change their mind in the future.   Reviewed CDC recommendations for importance of the COVID-19 Vaccine as well for Tetanus vaccine, Pt has declined the vaccine today and should let us know if he changes his mind in the future.CL,RMA

## 2021-03-10 ENCOUNTER — Ambulatory Visit: Payer: Self-pay | Admitting: Physician Assistant

## 2021-03-10 ENCOUNTER — Encounter: Payer: Self-pay | Admitting: Physician Assistant

## 2021-03-10 ENCOUNTER — Telehealth: Payer: Self-pay | Admitting: Physician Assistant

## 2021-03-10 VITALS — HR 64 | Temp 98.4°F | Resp 14 | Ht 65.0 in | Wt 195.0 lb

## 2021-03-10 DIAGNOSIS — Z1389 Encounter for screening for other disorder: Secondary | ICD-10-CM

## 2021-03-10 LAB — CMP12+LP+TP+TSH+6AC+CBC/D/PLT
ALT: 87 IU/L — ABNORMAL HIGH (ref 0–44)
AST: 37 IU/L (ref 0–40)
Albumin/Globulin Ratio: 2 (ref 1.2–2.2)
Albumin: 4.9 g/dL (ref 4.1–5.2)
Alkaline Phosphatase: 91 IU/L (ref 44–121)
BUN/Creatinine Ratio: 13 (ref 9–20)
BUN: 11 mg/dL (ref 6–20)
Basophils Absolute: 0.1 10*3/uL (ref 0.0–0.2)
Basos: 1 %
Bilirubin Total: 0.5 mg/dL (ref 0.0–1.2)
Calcium: 9.6 mg/dL (ref 8.7–10.2)
Chloride: 102 mmol/L (ref 96–106)
Chol/HDL Ratio: 5 ratio (ref 0.0–5.0)
Cholesterol, Total: 190 mg/dL (ref 100–199)
Creatinine, Ser: 0.83 mg/dL (ref 0.76–1.27)
EOS (ABSOLUTE): 0.2 10*3/uL (ref 0.0–0.4)
Eos: 2 %
Estimated CHD Risk: 1 times avg. (ref 0.0–1.0)
Free Thyroxine Index: 2.1 (ref 1.2–4.9)
GGT: 67 IU/L — ABNORMAL HIGH (ref 0–65)
Globulin, Total: 2.5 g/dL (ref 1.5–4.5)
Glucose: 98 mg/dL (ref 65–99)
HDL: 38 mg/dL — ABNORMAL LOW (ref >39)
Hematocrit: 53.3 % — ABNORMAL HIGH (ref 37.5–51.0)
Hemoglobin: 17.4 g/dL (ref 13.0–17.7)
Immature Grans (Abs): 0.1 10*3/uL (ref 0.0–0.1)
Immature Granulocytes: 1 %
Iron: 135 ug/dL (ref 38–169)
LDH: 203 IU/L (ref 121–224)
LDL Chol Calc (NIH): 120 mg/dL — ABNORMAL HIGH (ref 0–99)
Lymphocytes Absolute: 2.5 10*3/uL (ref 0.7–3.1)
Lymphs: 27 %
MCH: 29.4 pg (ref 26.6–33.0)
MCHC: 32.6 g/dL (ref 31.5–35.7)
MCV: 90 fL (ref 79–97)
Monocytes Absolute: 0.7 10*3/uL (ref 0.1–0.9)
Monocytes: 7 %
Neutrophils Absolute: 5.7 10*3/uL (ref 1.4–7.0)
Neutrophils: 62 %
Phosphorus: 2.9 mg/dL (ref 2.8–4.1)
Platelets: 317 10*3/uL (ref 150–450)
Potassium: 4 mmol/L (ref 3.5–5.2)
RBC: 5.92 x10E6/uL — ABNORMAL HIGH (ref 4.14–5.80)
RDW: 12 % (ref 11.6–15.4)
Sodium: 140 mmol/L (ref 134–144)
T3 Uptake Ratio: 29 % (ref 24–39)
T4, Total: 7.4 ug/dL (ref 4.5–12.0)
TSH: 3.74 u[IU]/mL (ref 0.450–4.500)
Total Protein: 7.4 g/dL (ref 6.0–8.5)
Triglycerides: 182 mg/dL — ABNORMAL HIGH (ref 0–149)
Uric Acid: 6.6 mg/dL (ref 3.8–8.4)
VLDL Cholesterol Cal: 32 mg/dL (ref 5–40)
WBC: 9.2 10*3/uL (ref 3.4–10.8)
eGFR: 123 mL/min/{1.73_m2} (ref >59)

## 2021-03-10 LAB — POCT URINALYSIS DIPSTICK
Bilirubin, UA: NEGATIVE
Blood, UA: POSITIVE
Glucose, UA: NEGATIVE
Ketones, UA: NEGATIVE
Leukocytes, UA: NEGATIVE
Nitrite, UA: NEGATIVE
Protein, UA: NEGATIVE
Spec Grav, UA: 1.02 (ref 1.010–1.025)
Urobilinogen, UA: 0.2 E.U./dL
pH, UA: 6 (ref 5.0–8.0)

## 2021-03-10 MED ORDER — ORPHENADRINE CITRATE ER 100 MG PO TB12
100.0000 mg | ORAL_TABLET | Freq: Two times a day (BID) | ORAL | 0 refills | Status: AC
Start: 2021-03-10 — End: ?

## 2021-03-10 NOTE — Progress Notes (Signed)
   Subjective: Low back pain    Patient ID: Dale Archer, male    DOB: [birth year], 25 y.o.   MRN: [id number]  HPI Patient presents with bilateral mid back pain which occurred last night.  Patient state he was unable to sleep comfortably secondary to the back pain.  Patient states no provocative incident for complaint.  Patient was concerned because there was a trace of blood in his urine review of the labs.  Patient denies gross hematuria.  Denies radicular component to back pain.  No bladder or bowel dysfunction.   Review of Systems Negative except for complaint     Objective:   Physical Exam  No acute distress.  Patient appears anxious.  Temperature is 98.4, pulse 64, respiration 14, BP is 158/110.  Blood pressure will be retaken prior to departure.  Patient 100% O2 sat on room air.  Patient was 95 pounds.  BMI is 32.4. Patient states this time without obvious discomfort.  No obvious lumbar spine deformity.  No CVA guarding.  Patient has full and equal range of motion of the lumbar spine.       Assessment & Plan: Back pain.   Advised patient urinalysis was unremarkable.  No acute findings on exam.  Patient complaint physical exam consistent with mild muscle strain.  Patient given discharge care instruction and prescription for Norflex to take as needed.  Return back to clinic back pain worsens.  Also recommended 3-day blood pressure check.

## 2021-03-10 NOTE — Telephone Encounter (Signed)
PT states he woke up with lower back pain. He was concerned when he saw he had blood in is urine sample he provided yesterday for his physical.

## 2021-03-15 ENCOUNTER — Encounter: Payer: Self-pay | Admitting: Physician Assistant

## 2021-03-15 ENCOUNTER — Ambulatory Visit: Payer: Self-pay | Admitting: Physician Assistant

## 2021-03-15 ENCOUNTER — Other Ambulatory Visit: Payer: Self-pay

## 2021-03-15 VITALS — BP 142/89 | HR 67 | Temp 98.0°F | Resp 16 | Ht 65.0 in | Wt 207.0 lb

## 2021-03-15 DIAGNOSIS — E785 Hyperlipidemia, unspecified: Secondary | ICD-10-CM

## 2021-03-15 DIAGNOSIS — E782 Mixed hyperlipidemia: Secondary | ICD-10-CM

## 2021-03-15 DIAGNOSIS — Z0000 Encounter for general adult medical examination without abnormal findings: Secondary | ICD-10-CM

## 2021-03-15 HISTORY — DX: Hyperlipidemia, unspecified: E78.5

## 2021-03-15 MED ORDER — SIMVASTATIN 40 MG PO TABS: 40.0000 mg | ORAL_TABLET | Freq: Every day | ORAL | 3 refills | Status: AC

## 2021-03-15 NOTE — Progress Notes (Signed)
   Subjective: Annual physical exam    Patient ID: Dale Archer, male    DOB: 07-18-1996, 25 y.o.   MRN: [id number]  HPI Patient presents annual physical exam.  No complaints of concerns.   Review of Systems Negative review of systems    Objective:   Physical Exam No acute distress.  Temperature 98, pulse 67, respiration 16, BP is 142/89, patient 90% O2 sat on room air.  Patient weighs 207 pounds and BMI is 34.4. HEENT is unremarkable.  Neck is supple without adenopathy or bruits.  Lungs are clear to auscultation.  Heart is regular rate and rhythm. Abdomen with negative HSM, normoactive bowel sounds, soft, nontender to palpation. No obvious deformity to the upper or lower extremities.  Patient has full and equal range of motion of the upper and lower extremity. No obvious cervical or lumbar spine deformity.  Patient has full and equal range of motion cervical lumbar spine.  Cranial nerves II through XII are grossly intact.  DTR 2+ without clonus.         Assessment & Plan: Well exam.   Discussed lab results with patient which shows hyperlipidemia.  Patient is amenable to a trial of medications along with diet and exercise.  Patient will follow-up in 6 months.

## 2021-03-15 NOTE — Progress Notes (Signed)
States he hasn't picked up the muscle relaxer from the pharmacy, but intends to get it this week.  Wife pregnant & had to go with her to appointments & unable to come for BP checks the past two afternoons.  AMD

## 2021-06-23 ENCOUNTER — Other Ambulatory Visit: Payer: Self-pay

## 2021-06-23 DIAGNOSIS — R509 Fever, unspecified: Secondary | ICD-10-CM

## 2021-06-23 LAB — POCT INFLUENZA A/B
Influenza A, POC: POSITIVE — AB
Influenza B, POC: NEGATIVE

## 2021-06-23 LAB — POC COVID19 BINAXNOW: SARS Coronavirus 2 Ag: NEGATIVE

## 2021-06-23 NOTE — Progress Notes (Signed)
S/Sx started yesterday: Fever of 102 F Runny nose Headache Cough - productive - green phlegm with some blood in it Sore throat Body aches Left ear - popping & sharp pain when turns neck Denies teeth discomfort  Has only take some tylenol  Rapid Influenza A = Positive Rapid Influenza B = Negative Rapid Covid = Negative  AMD

## 2021-09-15 ENCOUNTER — Other Ambulatory Visit: Payer: Medicaid Other

## 2021-09-15 DIAGNOSIS — Z0000 Encounter for general adult medical examination without abnormal findings: Secondary | ICD-10-CM
